# Patient Record
Sex: Male | Born: 1980 | Race: White | Hispanic: No | Smoking: Never smoker
Health system: Southern US, Community
[De-identification: ages and names within clinical notes are randomized; demographics above are authoritative.]

## PROBLEM LIST (undated history)

## (undated) DIAGNOSIS — K219 Gastro-esophageal reflux disease without esophagitis: Secondary | ICD-10-CM

## (undated) DIAGNOSIS — G43909 Migraine, unspecified, not intractable, without status migrainosus: Secondary | ICD-10-CM

## (undated) HISTORY — PX: CYST EXCISION: SHX5701

## (undated) HISTORY — DX: Migraine, unspecified, not intractable, without status migrainosus: G43.909

## (undated) HISTORY — PX: WISDOM TOOTH EXTRACTION: SHX21

## (undated) HISTORY — DX: Gastro-esophageal reflux disease without esophagitis: K21.9

---

## 2005-08-23 ENCOUNTER — Emergency Department: Payer: Self-pay | Admitting: Unknown Physician Specialty
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
## 2014-06-01 ENCOUNTER — Ambulatory Visit: Payer: Self-pay | Admitting: Family Medicine

## 2015-05-24 ENCOUNTER — Ambulatory Visit: Payer: Self-pay | Admitting: Family Medicine

## 2015-05-25 ENCOUNTER — Encounter: Payer: Self-pay | Admitting: Family Medicine

## 2015-05-25 ENCOUNTER — Ambulatory Visit (INDEPENDENT_AMBULATORY_CARE_PROVIDER_SITE_OTHER): Payer: 59 | Admitting: Family Medicine

## 2015-05-25 VITALS — BP 135/84 | HR 85 | Temp 99.0°F | Resp 16 | Ht 70.0 in | Wt 230.6 lb

## 2015-05-25 DIAGNOSIS — R51 Headache: Secondary | ICD-10-CM

## 2015-05-25 DIAGNOSIS — K219 Gastro-esophageal reflux disease without esophagitis: Secondary | ICD-10-CM | POA: Diagnosis not present

## 2015-05-25 DIAGNOSIS — K5901 Slow transit constipation: Secondary | ICD-10-CM | POA: Diagnosis not present

## 2015-05-25 DIAGNOSIS — Z0000 Encounter for general adult medical examination without abnormal findings: Secondary | ICD-10-CM

## 2015-05-25 DIAGNOSIS — Z9109 Other allergy status, other than to drugs and biological substances: Secondary | ICD-10-CM

## 2015-05-25 DIAGNOSIS — Z91048 Other nonmedicinal substance allergy status: Secondary | ICD-10-CM | POA: Diagnosis not present

## 2015-05-25 DIAGNOSIS — R519 Headache, unspecified: Secondary | ICD-10-CM

## 2015-05-25 MED ORDER — PANTOPRAZOLE SODIUM 40 MG PO TBEC: 40.0000 mg | DELAYED_RELEASE_TABLET | Freq: Every day | ORAL | Status: DC

## 2015-05-25 MED ORDER — BENEFIBER PO POWD: 5.0000 mg | Freq: Every day | ORAL | Status: DC

## 2015-05-25 NOTE — Assessment & Plan Note (Addendum)
Recommend benefiber for constipation. Colace PRN for stool softener. Consider Amitiza if not improving. Increase fluids- water.

## 2015-05-25 NOTE — Assessment & Plan Note (Signed)
PRN claritin. If allergies are coming more frequent- consider allergy testing.

## 2015-05-25 NOTE — Progress Notes (Signed)
Subjective:    Patient ID: Evan Foster, male    DOB: 1980/05/28, 35 y.o.   MRN: [id number]  HPI: Evan Foster is a 35 y.o. male presenting on 05/25/2015 for Establish Care   HPI  Pt presents to establish care today. Previous care provider was [facility]. It has been 5  years since His last PCP visit. Records from previous provider will be requested and reviewed. Current medical problems include:  Heartburn/acid reflux- feels always bloated. Belching a lot. Not tried OTC. No sour taste. No regurg.  Migraine Headaches: Started a new job. In front a computer daily.  Dims screens.  Feels like eyesight is changing. Causes HA. Never seen an eye doctor. HA are daily if he doesn't look away from the screen. HA right about the eye. No sinus drainage or allergy. Takes excedrin extrastrength.  Concerned about recent allergies- developed allergy to cats. Does not have a cat in the house. Occasional symptoms-itching and sneezing. Previous history of bee stings with no allergies.  No has localized swelling and edema. No throat swelling or trouble breathing. Constipation: 1 BM every 3 days. Strains to have BM.  Feels bad when he cannot have a BM.   Health maintenance:  Last TDAP- within 10 years.    Past Medical History  Diagnosis Date  . Migraine   . GERD (gastroesophageal reflux disease)    Social History   Social History  . Marital Status: Married    Spouse Name: N/A  . Number of Children: N/A  . Years of Education: N/A   Occupational History  . Not on file.   Social History Main Topics  . Smoking status: Never Smoker   . Smokeless tobacco: Not on file  . Alcohol Use: No  . Drug Use: No  . Sexual Activity: Yes   Other Topics Concern  . Not on file   Social History Narrative  . No narrative on file   Family History  Problem Relation Age of Onset  . Cancer Mother     skin cancer  . Cancer Father     cancer   No current outpatient prescriptions on file prior to  visit.   No current facility-administered medications on file prior to visit.    Review of Systems  Constitutional: Negative for fever and chills.  HENT: Negative.   Respiratory: Negative for chest tightness, shortness of breath and wheezing.   Cardiovascular: Negative for chest pain, palpitations and leg swelling.  Gastrointestinal: Positive for constipation and abdominal distention (acid reflux. ). Negative for nausea, vomiting and abdominal pain.  Endocrine: Negative.   Genitourinary: Negative for dysuria, urgency, discharge, penile pain and testicular pain.  Musculoskeletal: Negative for back pain, joint swelling and arthralgias.  Skin: Negative.   Allergic/Immunologic: Positive for environmental allergies.  Neurological: Negative for dizziness, weakness, numbness and headaches.  Psychiatric/Behavioral: Negative for sleep disturbance and dysphoric mood.   Per HPI unless specifically indicated above     Objective:    BP 135/84 mmHg  Pulse 85  Temp(Src) 99 F (37.2 C) (Oral)  Resp 16  Ht  (1.778 m)  Wt 230 lb 9.6 oz (104.599 kg)  BMI 33.09 kg/m2  Wt Readings from Last 3 Encounters:  05/25/15 230 lb 9.6 oz (104.599 kg)    Physical Exam  Constitutional: He is oriented to person, place, and time. He appears well-developed and well-nourished. No distress.  HENT:  Head: Normocephalic and atraumatic.  Neck: Neck supple. No thyromegaly present.  Cardiovascular:  Normal rate, regular rhythm and normal heart sounds.  Exam reveals no gallop and no friction rub.   No murmur heard. Pulmonary/Chest: Effort normal and breath sounds normal. He has no wheezes.  Abdominal: Soft. Bowel sounds are normal. He exhibits no distension. There is no tenderness. There is no rebound.  Musculoskeletal: Normal range of motion. He exhibits no edema or tenderness.  Neurological: He is alert and oriented to person, place, and time. He has normal reflexes.  Skin: Skin is warm and dry. No rash  noted. No erythema.  Psychiatric: He has a normal mood and affect. His behavior is normal. Thought content normal.   No results found for this or any previous visit.    Assessment & Plan:   Problem List Items Addressed This Visit      Digestive   Slow transit constipation    Recommend benefiber for constipation. Colace PRN for stool softener. Consider Amitiza if not improving. Increase fluids- water.       Gastroesophageal reflux disease without esophagitis - Primary    Start Protonix once daily for acid reflux. Alarm symptoms reviewed.       Relevant Medications   pantoprazole (PROTONIX) 40 MG tablet   Wheat Dextrin (BENEFIBER) POWD   Other Relevant Orders   Comprehensive Metabolic Panel (CMET)   CBC with Differential     Other   Multiple environmental allergies    PRN claritin. If allergies are coming more frequent- consider allergy testing.       Chronic daily headache    Likely due to eye strain from looking at the computer. Recommend eye exam to update prescription.  Consider HA prophylaxis if eye exam in normal.        Other Visit Diagnoses    Preventative health care        Relevant Orders    Lipid Profile       Meds ordered this encounter  Medications  . pantoprazole (PROTONIX) 40 MG tablet    Sig: Take 1 tablet (40 mg total) by mouth daily.    Dispense:  30 tablet    Refill:  11    Order Specific Question:  Supervising Provider    Answer:  Janeann Forehand [phone]  . Wheat Dextrin (BENEFIBER) POWD    Sig: Take 5 mg by mouth at bedtime.    Refill:  0    Order Specific Question:  Supervising Provider    Answer:  Janeann Forehand [[id number]]      Follow up plan: Return in about 4 weeks (around 06/22/2015) for GERD.Marland Kitchen

## 2015-05-25 NOTE — Assessment & Plan Note (Signed)
Likely due to eye strain from looking at the computer. Recommend eye exam to update prescription.  Consider HA prophylaxis if eye exam in normal.

## 2015-05-25 NOTE — Patient Instructions (Addendum)
[facility] :[place] Main Office [phone] (Fax) Hours: 8am-5pm Mon-Fri  [facility]: [phone]  Constipation: Try Benefiber daily- 1/2 TBSP at night in hot water to help keep you regular. You can also try a colace stool softener.     Gastroesophageal Reflux Disease, Adult Normally, food travels down the esophagus and stays in the stomach to be digested. However, when a person has gastroesophageal reflux disease (GERD), food and stomach acid move back up into the esophagus. When this happens, the esophagus becomes sore and inflamed. Over time, GERD can create small holes (ulcers) in the lining of the esophagus.  CAUSES This condition is caused by a problem with the muscle between the esophagus and the stomach (lower esophageal sphincter, or LES). Normally, the LES muscle closes after food passes through the esophagus to the stomach. When the LES is weakened or abnormal, it does not close properly, and that allows food and stomach acid to go back up into the esophagus. The LES can be weakened by certain dietary substances, medicines, and medical conditions, including:  Tobacco use.  Pregnancy.  Having a hiatal hernia.  Heavy alcohol use.  Certain foods and beverages, such as coffee, chocolate, onions, and peppermint. RISK FACTORS This condition is more likely to develop in:  People who have an increased body weight.  People who have connective tissue disorders.  People who use NSAID medicines. SYMPTOMS Symptoms of this condition include:  Heartburn.  Difficult or painful swallowing.  The feeling of having a lump in the throat.  Abitter taste in the mouth.  Bad breath.  Having a large amount of saliva.  Having an upset or bloated stomach.  Belching.  Chest pain.  Shortness of breath or wheezing.  Ongoing (chronic) cough or a night-time cough.  Wearing away of tooth enamel.  Weight loss. Different conditions can cause chest  pain. Make sure to see your health care provider if you experience chest pain. DIAGNOSIS Your health care provider will take a medical history and perform a physical exam. To determine if you have mild or severe GERD, your health care provider may also monitor how you respond to treatment. You may also have other tests, including:  An endoscopy toexamine your stomach and esophagus with a small camera.  A test thatmeasures the acidity level in your esophagus.  A test thatmeasures how much pressure is on your esophagus.  A barium swallow or modified barium swallow to show the shape, size, and functioning of your esophagus. TREATMENT The goal of treatment is to help relieve your symptoms and to prevent complications. Treatment for this condition may vary depending on how severe your symptoms are. Your health care provider may recommend:  Changes to your diet.  Medicine.  Surgery. HOME CARE INSTRUCTIONS Diet  Follow a diet as recommended by your health care provider. This may involve avoiding foods and drinks such as:  Coffee and tea (with or without caffeine).  Drinks that containalcohol.  Energy drinks and sports drinks.  Carbonated drinks or sodas.  Chocolate and cocoa.  Peppermint and mint flavorings.  Garlic and onions.  Horseradish.  Spicy and acidic foods, including peppers, chili powder, curry powder, vinegar, hot sauces, and barbecue sauce.  Citrus fruit juices and citrus fruits, such as oranges, lemons, and limes.  Tomato-based foods, such as red sauce, chili, salsa, and pizza with red sauce.  Fried and fatty foods, such as donuts, french fries, potato chips, and high-fat dressings.  High-fat meats, such as hot  dogs and fatty cuts of red and white meats, such as rib eye steak, sausage, ham, and bacon.  High-fat dairy items, such as whole milk, butter, and cream cheese.  Eat small, frequent meals instead of large meals.  Avoid drinking large amounts of  liquid with your meals.  Avoid eating meals during the 2-3 hours before bedtime.  Avoid lying down right after you eat.  Do not exercise right after you eat. General Instructions  Pay attention to any changes in your symptoms.  Take over-the-counter and prescription medicines only as told by your health care provider. Do not take aspirin, ibuprofen, or other NSAIDs unless your health care provider told you to do so.  Do not use any tobacco products, including cigarettes, chewing tobacco, and e-cigarettes. If you need help quitting, ask your health care provider.  Wear loose-fitting clothing. Do not wear anything tight around your waist that causes pressure on your abdomen.  Raise (elevate) the head of your bed 6 inches (15cm).  Try to reduce your stress, such as with yoga or meditation. If you need help reducing stress, ask your health care provider.  If you are overweight, reduce your weight to an amount that is healthy for you. Ask your health care provider for guidance about a safe weight loss goal.  Keep all follow-up visits as told by your health care provider. This is important. SEEK MEDICAL CARE IF:  You have new symptoms.  You have unexplained weight loss.  You have difficulty swallowing, or it hurts to swallow.  You have wheezing or a persistent cough.  Your symptoms do not improve with treatment.  You have a hoarse voice. SEEK IMMEDIATE MEDICAL CARE IF:  You have pain in your arms, neck, jaw, teeth, or back.  You feel sweaty, dizzy, or light-headed.  You have chest pain or shortness of breath.  You vomit and your vomit looks like blood or coffee grounds.  You faint.  Your stool is bloody or black.  You cannot swallow, drink, or eat.   This information is not intended to replace advice given to you by your health care provider. Make sure you discuss any questions you have with your health care provider.   Document Released: 01/23/2005 Document  Revised: 01/04/2015 Document Reviewed: 08/10/2014 Elsevier Interactive Patient Education [employer]

## 2015-05-25 NOTE — Assessment & Plan Note (Signed)
Start Protonix once daily for acid reflux. Alarm symptoms reviewed.

## 2015-06-12 ENCOUNTER — Ambulatory Visit: Payer: Self-pay | Admitting: Family Medicine

## 2019-05-04 ENCOUNTER — Encounter: Payer: Self-pay | Admitting: Emergency Medicine

## 2019-05-04 ENCOUNTER — Ambulatory Visit
Admission: EM | Admit: 2019-05-04 | Discharge: 2019-05-04 | Disposition: A | Discharge status: Home / Self Care | Payer: 59 | Attending: Family Medicine | Admitting: Family Medicine

## 2019-05-04 ENCOUNTER — Other Ambulatory Visit: Payer: Self-pay

## 2019-05-04 DIAGNOSIS — I493 Ventricular premature depolarization: Secondary | ICD-10-CM

## 2019-05-04 DIAGNOSIS — R009 Unspecified abnormalities of heart beat: Secondary | ICD-10-CM

## 2019-05-04 NOTE — ED Provider Notes (Signed)
[facility]    CSN: [id number] Arrival date & time: 05/04/19  1253  History   Chief Complaint Chief Complaint  Patient presents with  . Irregular Heart Beat    APPT   HPI  39 year old male presents the above complaint.  Patient reports that over the past 2 days he has had sensation of a "hard" heartbeat which occurs every 5 to 10 minutes.  When he feels this he feels some discomfort and feels like he needs to take a deep breath.  He describes it as feeling as if his heart is beating forcefully.  No reports of palpitations.  Denies chest pain.  Does not really feel short of breath but feels the need to take a deep breath.  He states that he has had this previously several years ago and had a Holter monitor which was normal.  He reports recent stress in December.  He is feeling well otherwise.  No other associated symptoms.  No other complaints or concerns at this time.  PMH, Surgical Hx, Family Hx, Social History reviewed and updated as below.  Past Medical History:  Diagnosis Date  . GERD (gastroesophageal reflux disease)   . Migraine    Patient Active Problem List   Diagnosis Date Noted  . Slow transit constipation 05/25/2015  . Gastroesophageal reflux disease without esophagitis 05/25/2015  . Multiple environmental allergies 05/25/2015  . Chronic daily headache 05/25/2015   Past Surgical History:  Procedure Laterality Date  . CYST EXCISION    . WISDOM TOOTH EXTRACTION     Home Medications    Prior to Admission medications   Medication Sig Start Date End Date Taking? Authorizing Provider  pantoprazole (PROTONIX) 40 MG tablet Take 1 tablet (40 mg total) by mouth daily. 05/25/15 05/04/19  Luciana Axe, NP    Family History Family History  Problem Relation Age of Onset  . Cancer Mother        skin cancer  . Cancer Father        cancer    Social History Social History   Tobacco Use  . Smoking status: Never Smoker  . Smokeless tobacco: Current User      Types: Chew  Substance Use Topics  . Alcohol use: Yes    Comment: socially   . Drug use: No     Allergies   Phenergan [promethazine hcl]   Review of Systems Review of Systems  Constitutional: Negative.   Cardiovascular: Negative for chest pain.       Abnormal Heart beat.   Physical Exam Triage Vital Signs ED Triage Vitals  Enc Vitals Group     BP 05/04/19 1342 130/80     Pulse Rate 05/04/19 1342 73     Resp 05/04/19 1342 18     Temp 05/04/19 1342 98.6 F (37 C)     Temp Source 05/04/19 1342 Oral     SpO2 05/04/19 1342 100 %     Weight 05/04/19 1339 227 lb (103 kg)     Height 05/04/19 1339 5\' 10"  (1.778 m)     Head Circumference --      Peak Flow --      Pain Score 05/04/19 1339 0     Pain Loc --      Pain Edu? --      Excl. in [place]? --    Updated Vital Signs BP 130/80 (BP Location: Right Arm)   Pulse 73   Temp 98.6 F (37 C) (Oral)   Resp  18   Ht 5\' 10"  (1.778 m)   Wt 103 kg   SpO2 100%   BMI 32.57 kg/m   Visual Acuity Right Eye Distance:   Left Eye Distance:   Bilateral Distance:    Right Eye Near:   Left Eye Near:    Bilateral Near:     Physical Exam Vitals and nursing note reviewed.  Constitutional:      General: He is not in acute distress.    Appearance: Normal appearance. He is not ill-appearing.  HENT:     Head: Normocephalic and atraumatic.  Eyes:     General:        Right eye: No discharge.        Left eye: No discharge.     Conjunctiva/sclera: Conjunctivae normal.  Cardiovascular:     Rate and Rhythm: Normal rate and regular rhythm.     Heart sounds: No murmur.  Pulmonary:     Effort: Pulmonary effort is normal.     Breath sounds: Normal breath sounds. No wheezing, rhonchi or rales.  Neurological:     Mental Status: He is alert.  Psychiatric:        Mood and Affect: Mood normal.        Behavior: Behavior normal.    UC Treatments / Results  Labs (all labs ordered are listed, but only abnormal results are displayed) Labs  Reviewed - No data to display  EKG Interpretation: Normal sinus rhythm with a rate of 68.  Q-wave noted in lead III and aVF.  No ST or T wave changes.  Radiology No results found.  Procedures Procedures (including critical care time)  Medications Ordered in UC Medications - No data to display  Initial Impression / Assessment and Plan / UC Course  I have reviewed the triage vital signs and the nursing notes.  Pertinent labs & imaging results that were available during my care of the patient were reviewed by me and considered in my medical decision making (see chart for details).    39 year old male presents with suspected PVC.  These were not noted on EKG today.  Discussed sending for Holter monitor versus watchful waiting.  Patient elected for the latter.  I discussed the possibility of starting a beta-blocker and we elected not to proceed given age and potential side effects.  Supportive care.  Final Clinical Impressions(s) / UC Diagnoses   Final diagnoses:  PVC (premature ventricular contraction)   Discharge Instructions   None    ED Prescriptions    None     PDMP not reviewed this encounter.   31, Tommie Sams 05/04/19 1902

## 2019-05-04 NOTE — ED Triage Notes (Signed)
Patient c/o irregular heartbeat that he has had for several years. He states it was very infrequent until 2 days ago he has had it every 5-10 minutes and he feels short of breath when he feels the irregular beat. Denies chest pain.

## 2023-09-04 ENCOUNTER — Encounter: Payer: Self-pay | Admitting: Internal Medicine

## 2023-09-04 ENCOUNTER — Ambulatory Visit (INDEPENDENT_AMBULATORY_CARE_PROVIDER_SITE_OTHER): Admitting: Internal Medicine

## 2023-09-04 VITALS — BP 128/82 | Ht 70.0 in | Wt 242.4 lb

## 2023-09-04 DIAGNOSIS — K219 Gastro-esophageal reflux disease without esophagitis: Secondary | ICD-10-CM | POA: Diagnosis not present

## 2023-09-04 DIAGNOSIS — M26609 Unspecified temporomandibular joint disorder, unspecified side: Secondary | ICD-10-CM

## 2023-09-04 DIAGNOSIS — E66811 Obesity, class 1: Secondary | ICD-10-CM

## 2023-09-04 DIAGNOSIS — E6609 Other obesity due to excess calories: Secondary | ICD-10-CM

## 2023-09-04 DIAGNOSIS — Z6834 Body mass index (BMI) 34.0-34.9, adult: Secondary | ICD-10-CM

## 2023-09-04 DIAGNOSIS — R519 Headache, unspecified: Secondary | ICD-10-CM

## 2023-09-04 NOTE — Progress Notes (Signed)
 Subjective:    Patient ID: Evan Foster, male    DOB: 31-Aug-1980, 43 y.o.   MRN: [id number]  HPI  Patient presents to clinic today to establish care and for management of the conditions listed below.  GERD: Triggered by stress.  He takes tums as needed with some relief of symptoms.  There is no upper GI on file.  Migraines: These occur 1 x week.  Triggered by stress.  He is not taking any medication for this. He does not follow with neurology.  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  He has tried "massage therapy" which does seem to provide temporary relief.  He has seen a dentist in the past but did not feel like the dental device was helpful.  [facility] however he felt that this was "overkill".  He would like further evaluation and treatment at this time.    Review of Systems   Past Medical History:  Diagnosis Date   GERD (gastroesophageal reflux disease)    Migraine     No current outpatient medications on file.   No current facility-administered medications for this visit.    Allergies  Allergen Reactions   Phenergan [Promethazine Hcl]     Family History  Problem Relation Age of Onset   Cancer Mother        skin cancer   Cancer Father        cancer    Social History   Socioeconomic History   Marital status: Married    Spouse name: Not on file   Number of children: Not on file   Years of education: Not on file   Highest education level: Not on file  Occupational History   Not on file  Tobacco Use   Smoking status: Never   Smokeless tobacco: Current    Types: Chew  Vaping Use   Vaping status: Never Used  Substance and Sexual Activity   Alcohol use: Yes    Comment: socially    Drug use: No   Sexual activity: Yes  Other Topics Concern   Not on file  Social History Narrative   Not on file   Social Drivers of Health   Financial Resource Strain: Not on file  Food Insecurity: Not on file   Transportation Needs: Not on file  Physical Activity: Not on file  Stress: Not on file  Social Connections: Not on file  Intimate Partner Violence: Not on file     Constitutional: Patient reports intermittent headaches, fatigue.  Denies fever, malaise, or abrupt weight changes.  HEENT: Denies eye pain, eye redness, ear pain, ringing in the ears, wax buildup, runny nose, nasal congestion, bloody nose, or sore throat. Respiratory: Denies difficulty breathing, shortness of breath, cough or sputum production.   Cardiovascular: Denies chest pain, chest tightness, palpitations or swelling in the hands or feet.  Gastrointestinal: Patient reports intermittent reflux, constipation.  Denies abdominal pain, bloating, diarrhea or blood in the stool.  GU: Denies urgency, frequency, pain with urination, burning sensation, blood in urine, odor or discharge. Musculoskeletal: Pt reports right side jaw pain. Denies decrease in range of motion, difficulty with gait, muscle pain or joint swelling.  Skin: Denies redness, rashes, lesions or ulcercations.  Neurological: Denies dizziness, difficulty with memory, difficulty with speech or problems with balance and coordination.  Psych: Denies anxiety, depression, SI/HI.  No other specific complaints in a complete review of systems (except as listed in  HPI above).      Objective:   Physical Exam BP 128/82 (BP Location: Left Arm, Patient Position: Sitting, Cuff Size: Normal)   Ht 5\' 10"  (1.778 m)   Wt 242 lb 6.4 oz (110 kg)   BMI 34.78 kg/m   Wt Readings from Last 3 Encounters:  05/04/19 227 lb (103 kg)  05/25/15 230 lb 9.6 oz (104.6 kg)    General: Appears his stated age, obese, in NAD. Skin: Warm, dry and intact. No rashes, lesions or ulcerations noted. HEENT: Head: normal shape and size; Eyes: sclera white, no icterus, conjunctiva pink, PERRLA and EOMs intact; Throat/Mouth: Teeth present, mucosa pink and moist, no exudate, lesions or ulcerations  noted.  Neck:  Neck supple, trachea midline. No masses, lumps or thyromegaly present.  Cardiovascular: Normal rate and rhythm. S1,S2 noted.  No murmur, rubs or gallops noted.  Pulmonary/Chest: Normal effort and positive vesicular breath sounds. No respiratory distress. No wheezes, rales or ronchi noted.  Musculoskeletal: Clicking noted on the left side of the jaw with opening and closing of the mouth.  No pain with palpation of the right side of the jaw.  No difficulty with gait.  Neurological: Alert and oriented. Coordination normal.       Assessment & Plan:    RTC in 6 months for your annual exam Helayne Lo, NP

## 2023-09-04 NOTE — Patient Instructions (Signed)
 Temporomandibular Joint Syndrome  Temporomandibular joint syndrome (TMJ syndrome) is a condition that causes pain in the temporomandibular joints. These joints are located near your ears and allow your jaw to open and close. For people with TMJ syndrome, chewing, biting, or other movements of the jaw can be difficult or painful. TMJ syndrome is often mild and goes away within a few weeks. However, sometimes the condition becomes a long-term (chronic) problem. What are the causes? This condition may be caused by: Grinding your teeth or clenching your jaw. Some people do this when they are stressed. Arthritis. An injury to the jaw. A head or neck injury. Teeth or dentures that are not aligned well. In some cases, the cause of TMJ syndrome may not be known. What are the signs or symptoms? The most common symptom of this condition is aching pain on the side of the head in the area of the TMJ. Other symptoms may include: Pain when moving your jaw, such as when chewing or biting. Not being able to open your jaw all the way. Making a clicking sound when you open your mouth. Headache. Earache. Neck or shoulder pain. How is this diagnosed? This condition may be diagnosed based on: Your symptoms and medical history. A physical exam. Your health care provider may check the range of motion of your jaw. Imaging tests, such as X-rays or an MRI. You may also need to see your dentist, who will check if your teeth and jaw are lined up correctly. How is this treated? TMJ syndrome often goes away on its own. If treatment is needed, it may include: Eating soft foods and applying ice or heat. Medicines to relieve pain or inflammation. Medicines or massage to relax the muscles. A splint, bite plate, or mouthpiece to prevent teeth grinding or jaw clenching. Relaxation techniques or counseling to help reduce stress. A therapy for pain in which an electrical current is applied to the nerves through the skin  (transcutaneous electrical nerve stimulation). Acupuncture. This may help to relieve pain. Jaw surgery. This is rarely needed. Follow these instructions at home:  Eating and drinking Eat a soft diet if you are having trouble chewing. Avoid foods that require a lot of chewing. Do not chew gum. General instructions Take over-the-counter and prescription medicines only as told by your health care provider. If directed, put ice on the painful area. To do this: Put ice in a plastic bag. Place a towel between your skin and the bag. Leave the ice on for 20 minutes, 2-3 times a day. Remove the ice if your skin turns bright red. This is very important. If you cannot feel pain, heat, or cold, you have a greater risk of damage to the area. Apply a warm, wet cloth (warm compress) to the painful area as told. Massage your jaw area and do any jaw stretching exercises as told by your health care provider. If you were given a splint, bite plate, or mouthpiece, wear it as told by your health care provider. Keep all follow-up visits. This is important. Where to find more information [employer]: [employer] Contact a health care provider if: You have trouble eating. You have new or worsening symptoms. Get help right away if: Your jaw locks. Summary Temporomandibular joint syndrome (TMJ syndrome) is a condition that causes pain in the temporomandibular joints. These joints are located near your ears and allow your jaw to open and close. TMJ syndrome is often mild and goes away within  a few weeks. However, sometimes the condition becomes a long-term (chronic) problem. Symptoms include an aching pain on the side of the head in the area of the TMJ, pain when chewing or biting, and being unable to open your jaw all the way. You may also make a clicking sound when you open your mouth. TMJ syndrome often goes away on its own. If treatment is needed, it may  include medicines to relieve pain, reduce inflammation, or relax the muscles. A splint, bite plate, or mouthpiece may also be used to prevent teeth grinding or jaw clenching. This information is not intended to replace advice given to you by your health care provider. Make sure you discuss any questions you have with your health care provider. Document Revised: 11/26/2020 Document Reviewed: 11/26/2020 [employer]vier Patient Education  2024 [employer].

## 2023-09-04 NOTE — Assessment & Plan Note (Signed)
 Referral to oral and maxillofacial surgery for further evaluation and treatment

## 2023-09-04 NOTE — Assessment & Plan Note (Signed)
 Encouraged diet and exercise for weight loss ?

## 2023-09-04 NOTE — Assessment & Plan Note (Signed)
 Okay to continue Tums OTC as needed Encourage weight loss as this can help reduce reflux symptoms

## 2023-09-04 NOTE — Assessment & Plan Note (Signed)
 Encouraged rest reduction techniques Okay to take Tylenol or ibuprofen OTC if needed
